# Patient Record
Sex: Male | Born: 1995 | Race: Black or African American | Hispanic: No | Marital: Single | Smoking: Never smoker
Health system: Southern US, Community
[De-identification: ages and names within clinical notes are randomized; demographics above are authoritative.]

## PROBLEM LIST (undated history)

## (undated) DIAGNOSIS — G43909 Migraine, unspecified, not intractable, without status migrainosus: Secondary | ICD-10-CM

---
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
## 2018-12-17 ENCOUNTER — Encounter (HOSPITAL_BASED_OUTPATIENT_CLINIC_OR_DEPARTMENT_OTHER): Payer: Self-pay | Admitting: *Deleted

## 2018-12-17 ENCOUNTER — Emergency Department (HOSPITAL_BASED_OUTPATIENT_CLINIC_OR_DEPARTMENT_OTHER)
Admission: EM | Admit: 2018-12-17 | Discharge: 2018-12-17 | Disposition: A | Discharge status: Home / Self Care | Payer: BC Managed Care – PPO | Attending: Emergency Medicine | Admitting: Emergency Medicine

## 2018-12-17 ENCOUNTER — Other Ambulatory Visit: Payer: Self-pay

## 2018-12-17 DIAGNOSIS — G43409 Hemiplegic migraine, not intractable, without status migrainosus: Secondary | ICD-10-CM | POA: Insufficient documentation

## 2018-12-17 HISTORY — DX: Migraine, unspecified, not intractable, without status migrainosus: G43.909

## 2018-12-17 MED ORDER — KETOROLAC TROMETHAMINE 15 MG/ML IJ SOLN
15.0000 mg | Freq: Once | INTRAMUSCULAR | Status: AC
  Administered 2018-12-17: 15 mg via INTRAVENOUS
  Filled 2018-12-17: qty 1

## 2018-12-17 MED ORDER — SODIUM CHLORIDE 0.9 % IV BOLUS
1000.0000 mL | Freq: Once | INTRAVENOUS | Status: AC
  Administered 2018-12-17: 1000 mL via INTRAVENOUS

## 2018-12-17 MED ORDER — DEXAMETHASONE SODIUM PHOSPHATE 10 MG/ML IJ SOLN
10.0000 mg | Freq: Once | INTRAMUSCULAR | Status: AC
  Administered 2018-12-17: 10 mg via INTRAVENOUS
  Filled 2018-12-17: qty 1

## 2018-12-17 MED ORDER — METOCLOPRAMIDE HCL 5 MG/ML IJ SOLN
10.0000 mg | Freq: Once | INTRAMUSCULAR | Status: AC
  Administered 2018-12-17: 10 mg via INTRAVENOUS
  Filled 2018-12-17: qty 2

## 2018-12-17 MED ORDER — DIPHENHYDRAMINE HCL 50 MG/ML IJ SOLN
25.0000 mg | Freq: Once | INTRAMUSCULAR | Status: AC
  Administered 2018-12-17: 25 mg via INTRAVENOUS
  Filled 2018-12-17: qty 1

## 2018-12-17 NOTE — ED Provider Notes (Signed)
   [place] [facility] Provider Note: Georgena Spurling, MD, FACEP  CSN: [id number] MRN: [id number] ARRIVAL: 12/17/18 at 0419 ROOM: [place]  Migraine   HISTORY OF PRESENT ILLNESS  12/17/18 4:29 AM Phillip Fox is a 23 y.o. male with a personal and strong family history of migraines.  He is here with an acute headache which is been present for 1 to 2 hours.  It is located over the right eye and in the right temple.  It has both dull and throbbing components.  He rates the pain as a 9 out of 10.  He denies blurred vision but has associated photophobia.  He has had nausea but no vomiting.  He has been getting similar headaches almost daily for the last week.  He has taken acetaminophen without relief.   Past Medical History:  Diagnosis Date  . Migraine     History reviewed. No pertinent surgical history.  Family History  Problem Relation Age of Onset  . Migraines Mother   . Migraines Father   . Migraines Maternal Grandmother     Social History   Tobacco Use  . Smoking status: Never Smoker  Substance Use Topics  . Alcohol use: No  . Drug use: No    Prior to Admission medications   Not on File    Allergies Patient has no known allergies.   REVIEW OF SYSTEMS  Negative except as noted here or in the History of Present Illness.   PHYSICAL EXAMINATION  Initial Vital Signs Blood pressure 126/69, pulse 65, temperature 97.7 F (36.5 C), temperature source Oral, resp. rate 18, height 5\' 9"  (1.753 m), weight 66.2 kg, SpO2 99 %.  Examination General: Well-developed, well-nourished male in no acute distress; appearance consistent with age of record HENT: normocephalic; atraumatic Eyes: pupils equal, round and reactive to light; extraocular muscles intact; photophobia Neck: supple Heart: regular rate and rhythm Lungs: clear to auscultation bilaterally Abdomen: soft; nondistended; nontender; bowel sounds present Extremities: No deformity; full range  of motion; pulses normal Neurologic: Awake, alert and oriented; motor function intact in all extremities and symmetric; no facial droop Skin: Warm and dry Psychiatric: Flat affect   RESULTS  Summary of this visit's results, reviewed by myself:   EKG Interpretation  Date/Time:    Ventricular Rate:    PR Interval:    QRS Duration:   QT Interval:    QTC Calculation:   R Axis:     Text Interpretation:        Laboratory Studies: No results found for this or any previous visit (from the past 24 hour(s)). Imaging Studies: No results found.  ED COURSE and MDM  Nursing notes and initial vitals signs, including pulse oximetry, reviewed.  Vitals:   12/17/18 0426  BP: 126/69  Pulse: 65  Resp: 18  Temp: 97.7 F (36.5 C)  TempSrc: Oral  SpO2: 99%  Weight: 66.2 kg  Height: 5\' 9"  (1.753 m)   5:14 AM Patient's headache resolved after IV fluids and medications.  He states he is ready to go home.  PROCEDURES    ED DIAGNOSES     ICD-10-CM   1. Sporadic migraine  G43.[place], MD 12/17/18 [phone]

## 2018-12-17 NOTE — ED Triage Notes (Signed)
Migraine x 1 week.  Over right eye.  Hx of same.  Took tylenol PTA

## 2020-08-30 ENCOUNTER — Emergency Department (HOSPITAL_BASED_OUTPATIENT_CLINIC_OR_DEPARTMENT_OTHER): Payer: BC Managed Care – PPO

## 2020-08-30 ENCOUNTER — Other Ambulatory Visit: Payer: Self-pay

## 2020-08-30 ENCOUNTER — Emergency Department (HOSPITAL_BASED_OUTPATIENT_CLINIC_OR_DEPARTMENT_OTHER)
Admission: EM | Admit: 2020-08-30 | Discharge: 2020-08-30 | Disposition: A | Discharge status: Home / Self Care | Payer: BC Managed Care – PPO | Attending: Emergency Medicine | Admitting: Emergency Medicine

## 2020-08-30 ENCOUNTER — Encounter (HOSPITAL_BASED_OUTPATIENT_CLINIC_OR_DEPARTMENT_OTHER): Payer: Self-pay

## 2020-08-30 DIAGNOSIS — R110 Nausea: Secondary | ICD-10-CM | POA: Diagnosis not present

## 2020-08-30 DIAGNOSIS — R519 Headache, unspecified: Secondary | ICD-10-CM | POA: Insufficient documentation

## 2020-08-30 DIAGNOSIS — F17290 Nicotine dependence, other tobacco product, uncomplicated: Secondary | ICD-10-CM | POA: Insufficient documentation

## 2020-08-30 MED ORDER — KETOROLAC TROMETHAMINE 15 MG/ML IJ SOLN
15.0000 mg | Freq: Once | INTRAMUSCULAR | Status: AC
  Administered 2020-08-30: 15 mg via INTRAVENOUS
  Filled 2020-08-30: qty 1

## 2020-08-30 MED ORDER — PROCHLORPERAZINE EDISYLATE 10 MG/2ML IJ SOLN
10.0000 mg | Freq: Once | INTRAMUSCULAR | Status: AC
  Administered 2020-08-30: 10 mg via INTRAVENOUS
  Filled 2020-08-30: qty 2

## 2020-08-30 MED ORDER — DIPHENHYDRAMINE HCL 50 MG/ML IJ SOLN
25.0000 mg | Freq: Once | INTRAMUSCULAR | Status: AC
  Administered 2020-08-30: 25 mg via INTRAVENOUS
  Filled 2020-08-30: qty 1

## 2020-08-30 NOTE — ED Provider Notes (Signed)
[facility] EMERGENCY DEPARTMENT Provider Note   CSN: [id number] Arrival date & time: 08/30/20  1157     History Chief Complaint  Patient presents with  . Headache    Phillip Fox is a 25 y.o. male.  HPI 25 year old male with a history of migraines presents to the ER with complaints of headache.  Patient states he has been having intermittent headaches over the last couple weeks, however today he woke up with a much more severe headache than normal.  He endorses pulsating pain, light sensitivity.  No vision changes.  Mild nausea but no vomiting.  He reports prior history of migraines and headaches, but has not been seen by neurology.  He took Tylenol this morning with little relief.  Denies any unilateral weakness, word slurring, difficulty speaking.    Past Medical History:  Diagnosis Date  . Migraine     There are no problems to display for this patient.   No past surgical history on file.     Family History  Problem Relation Age of Onset  . Migraines Mother   . Migraines Father   . Migraines Maternal Grandmother     Social History   Tobacco Use  . Smoking status: Never Smoker  . Smokeless tobacco: Current User  Vaping Use  . Vaping Use: Every day  Substance Use Topics  . Alcohol use: No  . Drug use: No    Home Medications Prior to Admission medications   Not on File    Allergies    Patient has no known allergies.  Review of Systems   Review of Systems  Eyes: Negative for visual disturbance.  Neurological: Positive for headaches. Negative for dizziness, syncope and facial asymmetry.    Physical Exam Updated Vital Signs BP 124/77 (BP Location: Right Arm)   Pulse 64   Temp 98.2 F (36.8 C) (Oral)   Resp 18   Ht 5\' 9"  (1.753 m)   Wt 67.1 kg   SpO2 98%   BMI 21.86 kg/m   Physical Exam Vitals and nursing note reviewed.  Constitutional:      General: He is not in acute distress.    Appearance: He is well-developed. He is not  ill-appearing, toxic-appearing or diaphoretic.  HENT:     Head: Normocephalic and atraumatic.  Eyes:     General: No visual field deficit.    Conjunctiva/sclera: Conjunctivae normal.  Cardiovascular:     Rate and Rhythm: Normal rate and regular rhythm.     Heart sounds: No murmur heard.   Pulmonary:     Effort: Pulmonary effort is normal. No respiratory distress.     Breath sounds: Normal breath sounds.  Abdominal:     Palpations: Abdomen is soft.     Tenderness: There is no abdominal tenderness.  Musculoskeletal:     Cervical back: Neck supple.  Skin:    General: Skin is warm and dry.  Neurological:     Mental Status: He is alert and oriented to person, place, and time.     GCS: GCS eye subscore is 4. GCS verbal subscore is 5. GCS motor subscore is 6.     Cranial Nerves: No cranial nerve deficit, dysarthria or facial asymmetry.     Sensory: No sensory deficit.     Motor: No weakness.     Deep Tendon Reflexes: Reflexes normal.  Psychiatric:        Mood and Affect: Mood normal.        Behavior: Behavior normal.  ED Results / Procedures / Treatments   Labs (all labs ordered are listed, but only abnormal results are displayed) Labs Reviewed - No data to display  EKG None  Radiology CT Head Wo Contrast  Result Date: 08/30/2020 CLINICAL DATA:  Progressive headache and nausea.  Photophobia EXAM: CT HEAD WITHOUT CONTRAST TECHNIQUE: Contiguous axial images were obtained from the base of the skull through the vertex without intravenous contrast. COMPARISON:  None. FINDINGS: Brain: Ventricles and sulci are normal in size and configuration. There is no intracranial mass, hemorrhage, extra-axial fluid collection, or midline shift. Brain parenchyma appears unremarkable. No evident acute infarct. Vascular: No hyperdense vessel.  No evident vascular calcification. Skull: The bony calvarium appears intact. Sinuses/Orbits: There is a small retention cyst in the lateral left maxillary  antrum. Slight mucosal thickening noted in several ethmoid air cells. Other visualized paranasal sinuses are clear. Orbits appear symmetric bilaterally. Other: Mastoid air cells are clear. IMPRESSION: Mild paranasal sinus disease.  Study otherwise unremarkable. Electronically Signed   By: Bretta Bang III M.D.   On: 08/30/2020 12:33    Procedures Procedures   Medications Ordered in ED Medications  prochlorperazine (COMPAZINE) injection 10 mg (10 mg Intravenous Given 08/30/20 1254)  diphenhydrAMINE (BENADRYL) injection 25 mg (25 mg Intravenous Given 08/30/20 1250)  ketorolac (TORADOL) 15 MG/ML injection 15 mg (15 mg Intravenous Given 08/30/20 1252)    ED Course  I have reviewed the triage vital signs and the nursing notes.  Pertinent labs & imaging results that were available during my care of the patient were reviewed by me and considered in my medical decision making (see chart for details).    MDM Rules/Calculators/A&P                          25 year old male presents to the ER with complaints of headache.  Neuro exam overall reassuring, no meningeal signs, vitals reassuring.  Patient does report increased severity from prior headaches.  Per chart review, he has had no head imaging.  CT of the head here without any evidence of space-occupying lesions, bleeds.  CT does comment on paraspinal sinus disease but patient does not complain of any sinus congestion.  Patient received migraine cocktail, Toradol with significant improvement in his symptoms.  He is feeling well enough to go home.  Will refer to neurology.  We discussed return precautions.  He was understanding and is agreeable Final Clinical Impression(s) / ED Diagnoses Final diagnoses:  Bad headache    Rx / DC Orders ED Discharge Orders    None       Leone Brand 08/30/20 1347    Cheryll Cockayne, MD 09/05/20 2316

## 2020-08-30 NOTE — Discharge Instructions (Addendum)
You were evaluated in the Emergency Department and after careful evaluation, we did not find any emergent condition requiring admission or further testing in the hospital.  Please make an appointment with neurology for follow-up.  Please return to the Emergency Department if you experience any worsening of your condition.  We encourage you to follow up with a primary care provider.  Thank you for allowing [place] to be a part of your care.

## 2020-08-30 NOTE — ED Triage Notes (Signed)
HA x 3 hours today.  Pt reports intermittent HA x 2 weeks.  PT does endorse nausea with light sensitivity but no vomiting.

## 2020-10-02 ENCOUNTER — Emergency Department (HOSPITAL_BASED_OUTPATIENT_CLINIC_OR_DEPARTMENT_OTHER)
Admission: EM | Admit: 2020-10-02 | Discharge: 2020-10-03 | Disposition: A | Discharge status: Home / Self Care | Payer: BC Managed Care – PPO | Attending: Emergency Medicine | Admitting: Emergency Medicine

## 2020-10-02 ENCOUNTER — Other Ambulatory Visit: Payer: Self-pay

## 2020-10-02 ENCOUNTER — Encounter (HOSPITAL_BASED_OUTPATIENT_CLINIC_OR_DEPARTMENT_OTHER): Payer: Self-pay | Admitting: *Deleted

## 2020-10-02 DIAGNOSIS — S01551A Open bite of lip, initial encounter: Secondary | ICD-10-CM | POA: Insufficient documentation

## 2020-10-02 DIAGNOSIS — W540XXA Bitten by dog, initial encounter: Secondary | ICD-10-CM | POA: Insufficient documentation

## 2020-10-02 DIAGNOSIS — Z23 Encounter for immunization: Secondary | ICD-10-CM | POA: Insufficient documentation

## 2020-10-02 DIAGNOSIS — S0993XA Unspecified injury of face, initial encounter: Secondary | ICD-10-CM | POA: Diagnosis present

## 2020-10-02 MED ORDER — TETANUS-DIPHTH-ACELL PERTUSSIS 5-2.5-18.5 LF-MCG/0.5 IM SUSY
0.5000 mL | PREFILLED_SYRINGE | Freq: Once | INTRAMUSCULAR | Status: AC
  Administered 2020-10-02: 0.5 mL via INTRAMUSCULAR
  Filled 2020-10-02: qty 0.5

## 2020-10-02 MED ORDER — AMOXICILLIN-POT CLAVULANATE 875-125 MG PO TABS
1.0000 | ORAL_TABLET | Freq: Once | ORAL | Status: AC
  Administered 2020-10-02: 1 via ORAL
  Filled 2020-10-02: qty 1

## 2020-10-02 MED ORDER — LIDOCAINE-EPINEPHRINE-TETRACAINE (LET) TOPICAL GEL
3.0000 mL | Freq: Once | TOPICAL | Status: AC
  Administered 2020-10-02: 3 mL via TOPICAL
  Filled 2020-10-02: qty 3

## 2020-10-02 NOTE — ED Provider Notes (Signed)
LACERATION REPAIR Performed by: Linwood Dibbles Authorized by: Linwood Dibbles Consent: Verbal consent obtained. Risks and benefits: risks, benefits and alternatives were discussed Consent given by: patient Patient identity confirmed: provided demographic data Prepped and Draped in normal sterile fashion Wound explored  Laceration Location: upper lip  Laceration Length: 5cm, jagged, stellate   No Foreign Bodies seen or palpated  Anesthesia: local infiltration  Local anesthetic: LET  Anesthetic total: Topical LET  Irrigation method: syringe Amount of cleaning: standard  Skin closure: 5.0 Prolene  Number of sutures: 8  Technique: simple interrupted   Patient tolerance: Patient tolerated the procedure well with no immediate complications.    Cleatus Goodin A, PA-C 10/03/20 0022    Molpus, Jonny Ruiz, MD 10/03/20 [phone]

## 2020-10-02 NOTE — ED Provider Notes (Signed)
[facility] Provider Note: Lowella Dell, MD, FACEP  CSN: [id number] MRN: [id number] ARRIVAL: 10/02/20 at 1906 ROOM: [facility]   CHIEF COMPLAINT  Animal Bite   HISTORY OF PRESENT ILLNESS  10/02/20 11:33 PM Phillip Fox is a 25 y.o. male who was bitten by his dog about 6:30 PM.  The dog bit his upper lip and he has several lacerations to his upper lip.  Bleeding is controlled.  Associated pain is about a 5 out of 10, worse with movement or palpation of his lip.  He is not sure about his tetanus status.  His dog's immunizations are up-to-date.   Past Medical History:  Diagnosis Date   Migraine     History reviewed. No pertinent surgical history.  Family History  Problem Relation Age of Onset   Migraines Mother    Migraines Father    Migraines Maternal Grandmother     Social History   Tobacco Use   Smoking status: Never   Smokeless tobacco: Current  Vaping Use   Vaping Use: Every day  Substance Use Topics   Alcohol use: No   Drug use: No    Prior to Admission medications   Not on File    Allergies Patient has no known allergies.   REVIEW OF SYSTEMS  Negative except as noted here or in the History of Present Illness.   PHYSICAL EXAMINATION  Initial Vital Signs Blood pressure 119/75, pulse (!) 46, temperature 97.8 F (36.6 C), temperature source Axillary, resp. rate 18, height 5\' 9"  (1.753 m), weight 64 kg, SpO2 100 %.  Examination General: Well-developed, well-nourished male in no acute distress; appearance consistent with age of record HENT: normocephalic; lacerations to skin of upper lip with swelling, no lacerations on buccal surface of lip:    Eyes: pupils equal, round and reactive to light; extraocular muscles intact Neck: supple Heart: regular rate and rhythm Lungs: clear to auscultation bilaterally Abdomen: soft; nondistended; nontender; bowel sounds present Extremities: No deformity; full range of motion Neurologic: Awake,  alert and oriented; motor function intact in all extremities and symmetric; no facial droop Skin: Warm and dry Psychiatric: Normal mood and affect   RESULTS  Summary of this visit's results, reviewed and interpreted by myself:   EKG Interpretation  Date/Time:    Ventricular Rate:    PR Interval:    QRS Duration:   QT Interval:    QTC Calculation:   R Axis:     Text Interpretation:          Laboratory Studies: No results found for this or any previous visit (from the past 24 hour(s)). Imaging Studies: No results found.  ED COURSE and MDM  Nursing notes, initial and subsequent vitals signs, including pulse oximetry, reviewed and interpreted by myself.  Vitals:   10/02/20 1926 10/02/20 1930 10/02/20 2208 10/03/20 0000  BP:  113/69 119/75 120/65  Pulse:  66 (!) 46 (!) 52  Resp:  14 18 18   Temp:  97.8 F (36.6 C)    TempSrc:  Axillary    SpO2:  100% 100% 100%  Weight: 64 kg     Height: 5\' 9"  (1.753 m)      Medications  Tdap (BOOSTRIX) injection 0.5 mL (0.5 mLs Intramuscular Given 10/02/20 2354)  amoxicillin-clavulanate (AUGMENTIN) 875-125 MG per tablet 1 tablet (1 tablet Oral Given 10/02/20 2354)  lidocaine-EPINEPHrine-tetracaine (LET) topical gel (3 mLs Topical Given 10/02/20 2349)   Wounds closed by Britni Hendley, PA-C.  Will place on Augmentin for dog  bite prophylaxis.    PROCEDURES  Procedures   ED DIAGNOSES     ICD-10-CM   1. Dog bite of vermilion of upper lip, initial encounter  S01.551A    W54.0XXA     2. Dog bite of skin of lip, initial encounter  S01.551A    W54.Olin Hauser, MD 10/03/20 [phone]

## 2020-10-02 NOTE — ED Notes (Signed)
ED Provider at bedside. 

## 2020-10-02 NOTE — ED Triage Notes (Signed)
His dog bit him his upper lip. Through and through lacerations noted.

## 2020-10-03 MED ORDER — AMOXICILLIN-POT CLAVULANATE 875-125 MG PO TABS: 1.0000 | ORAL_TABLET | Freq: Two times a day (BID) | ORAL | 0 refills | Status: AC

## 2020-11-26 ENCOUNTER — Telehealth: Payer: Self-pay | Admitting: Neurology

## 2020-11-26 ENCOUNTER — Ambulatory Visit: Payer: BC Managed Care – PPO | Admitting: Neurology

## 2020-11-26 ENCOUNTER — Telehealth: Payer: Self-pay | Admitting: *Deleted

## 2020-11-26 ENCOUNTER — Encounter: Payer: Self-pay | Admitting: Neurology

## 2020-11-26 NOTE — Telephone Encounter (Signed)
Patient no showed his new patient appt today. This is his first no show at [facility].

## 2020-11-26 NOTE — Telephone Encounter (Signed)
No-show for neurology.  This was new patient appointment.  If patient calls back they can be scheduled with any provider however please remind him that we have many patients waiting for appointments in neurology, and another no-show or cancellation within 24 hours may result in dismissal from the practice per office policy.

## 2020-11-26 NOTE — Progress Notes (Deleted)
[facility]    Provider:  Dr Lucia Gaskins Requesting Provider: Roe Coombs* Primary Care Provider:  [facility]  CC:  migraine  HPI:  Phillip Fox is a 25 y.o. male here as requested by Roe Coombs* for migraines. PMHx migraine.  Patient was referred from the emergency room, history of migraines, she was seen in the emergency room in early June of this year, intermittent headaches, that day woke up with a much more severe headache than normal, pulsating pain, light sensitivity, no vision changes, mild nausea but no vomiting, he reports prior history of migraines and headaches has not been seen by neurology, Tylenol did not help no other focal neurologic symptoms were present.  Examination including physical and neurologic was normal.  CT of the head was completed unremarkable.  He received a migraine cocktail.  Reviewed notes, labs and imaging from outside physicians, which showed:    CT head 08/30/2020: showed No acute intracranial abnormalities including mass lesion or mass effect, hydrocephalus, extra-axial fluid collection, midline shift, hemorrhage, or acute infarction, large ischemic events (personally reviewed images)    Review of Systems: Patient complains of symptoms per HPI as well as the following symptoms ***. Pertinent negatives and positives per HPI. All others negative.   Social History   Socioeconomic History   Marital status: Single    Spouse name: Not on file   Number of children: Not on file   Years of education: Not on file   Highest education level: Not on file  Occupational History   Not on file  Tobacco Use   Smoking status: Never   Smokeless tobacco: Current  Vaping Use   Vaping Use: Every day  Substance and Sexual Activity   Alcohol use: No   Drug use: No   Sexual activity: Not on file  Other Topics Concern   Not on file  Social History Narrative   Not on file   Social  Determinants of Health   Financial Resource Strain: Not on file  Food Insecurity: Not on file  Transportation Needs: Not on file  Physical Activity: Not on file  Stress: Not on file  Social Connections: Not on file  Intimate Partner Violence: Not on file    Family History  Problem Relation Age of Onset   Migraines Mother    Migraines Father    Migraines Maternal Grandmother     Past Medical History:  Diagnosis Date   Migraine     There are no problems to display for this patient.   No past surgical history on file.  Current Outpatient Medications  Medication Sig Dispense Refill   amoxicillin-clavulanate (AUGMENTIN) 875-125 MG tablet Take 1 tablet by mouth every 12 (twelve) hours. 14 tablet 0   No current facility-administered medications for this visit.    Allergies as of 11/26/2020   (No Known Allergies)    Vitals: There were no vitals taken for this visit. Last Weight:  Wt Readings from Last 1 Encounters:  10/02/20 141 lb 1.6 oz (64 kg)   Last Height:   Ht Readings from Last 1 Encounters:  10/02/20 5\' 9"  (1.753 m)     Physical exam: Exam: Gen: NAD, conversant, well nourised, obese, well groomed                     CV: RRR, no MRG. No Carotid Bruits. No peripheral edema, warm, nontender Eyes: Conjunctivae clear without exudates or hemorrhage  Neuro: Detailed Neurologic Exam  Speech:  Speech is normal; fluent and spontaneous with normal comprehension.  Cognition:    The patient is oriented to person, place, and time;     recent and remote memory intact;     language fluent;     normal attention, concentration,     fund of knowledge Cranial Nerves:    The pupils are equal, round, and reactive to light. The fundi are normal and spontaneous venous pulsations are present. Visual fields are full to finger confrontation. Extraocular movements are intact. Trigeminal sensation is intact and the muscles of mastication are normal. The face is symmetric.  The palate elevates in the midline. Hearing intact. Voice is normal. Shoulder shrug is normal. The tongue has normal motion without fasciculations.   Coordination:    Normal finger to nose and heel to shin. Normal rapid alternating movements.   Gait:    Heel-toe and tandem gait are normal.   Motor Observation:    No asymmetry, no atrophy, and no involuntary movements noted. Tone:    Normal muscle tone.    Posture:    Posture is normal. normal erect    Strength:    Strength is V/V in the upper and lower limbs.      Sensation: intact to LT     Reflex Exam:  DTR's:    Deep tendon reflexes in the upper and lower extremities are normal bilaterally.   Toes:    The toes are downgoing bilaterally.   Clonus:    Clonus is absent.    Assessment/Plan:    No orders of the defined types were placed in this encounter.  No orders of the defined types were placed in this encounter.   Cc: [place], Roe Coombs, [facility]  Naomie Dean, MD  [facility] [address] [place], [place] [address]  Phone [phone] Fax [phone]

## 2022-05-10 IMAGING — CT CT HEAD W/O CM
3 series · 16 of 47 positions shown, 19 images · non-contrast
Comparison: None.

CLINICAL DATA: Progressive headache and nausea.  Photophobia

EXAM:
CT HEAD WITHOUT CONTRAST
TECHNIQUE: Contiguous axial images were obtained from the base of the skull
through the vertex without intravenous contrast.

[Series 2: head wo · axial · 0.43mm/px · z∈[-114,+22]mm · 10 of 33 slices shown, 13 images]
[im 3/33  brain]
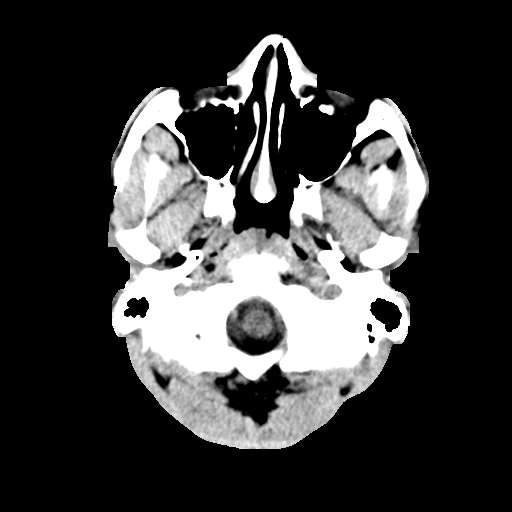
[im 3/33  bone]
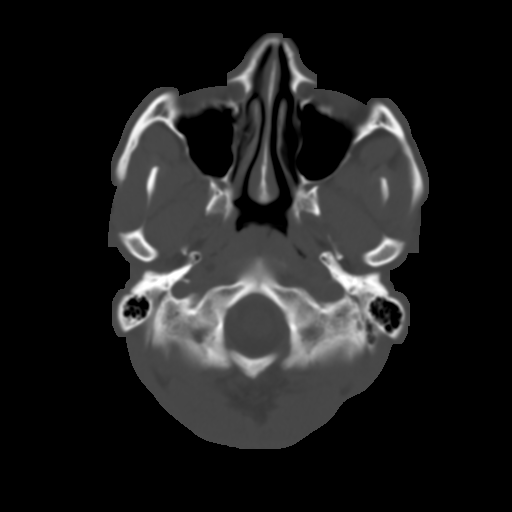
[im 6/33  brain]
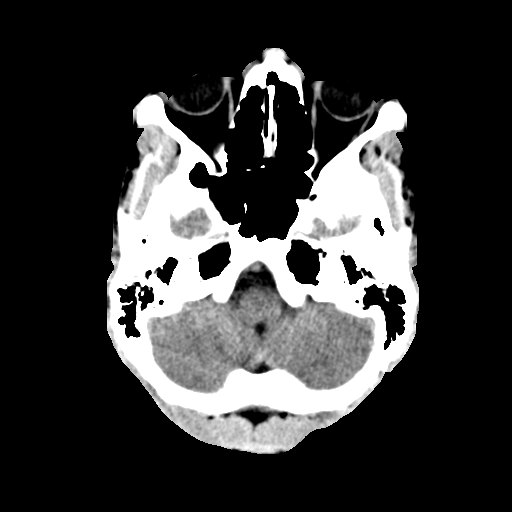
[im 9/33  brain]
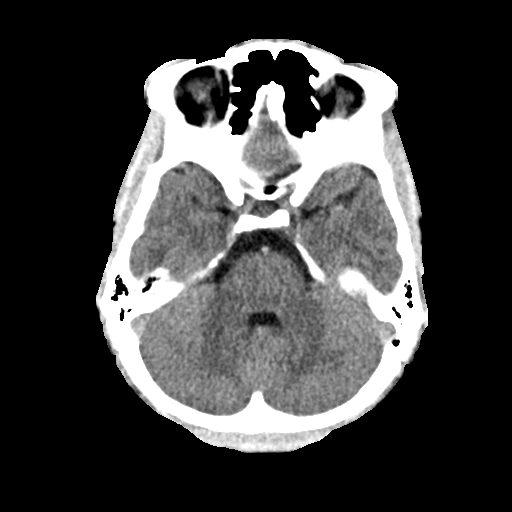
[im 12/33  brain]
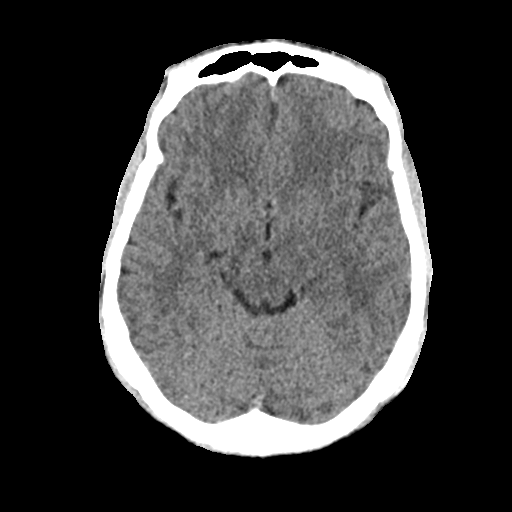
[im 15/33  brain]
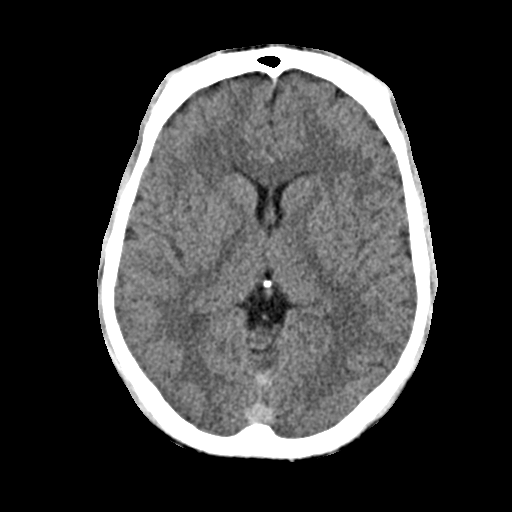
[im 15/33  bone]
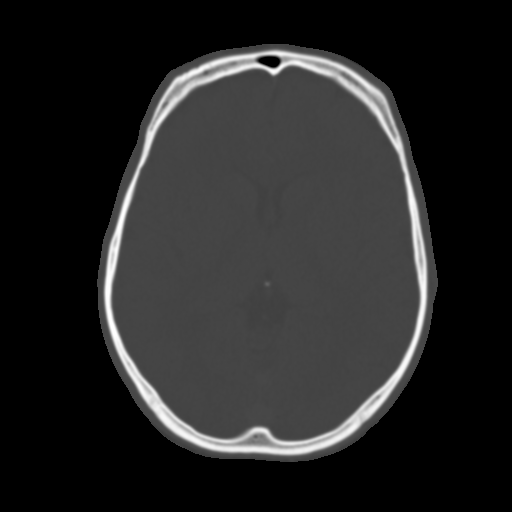
[im 18/33  brain]
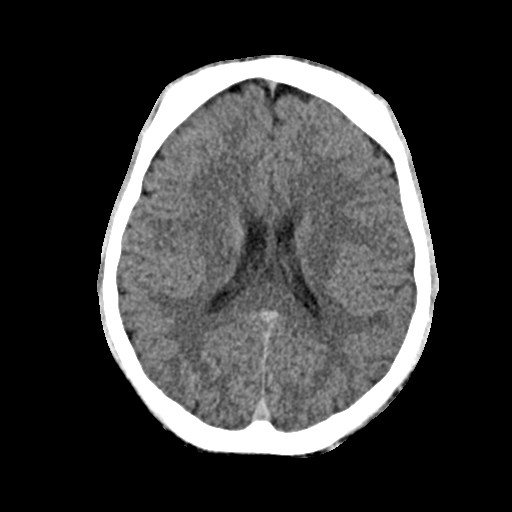
[im 21/33  brain]
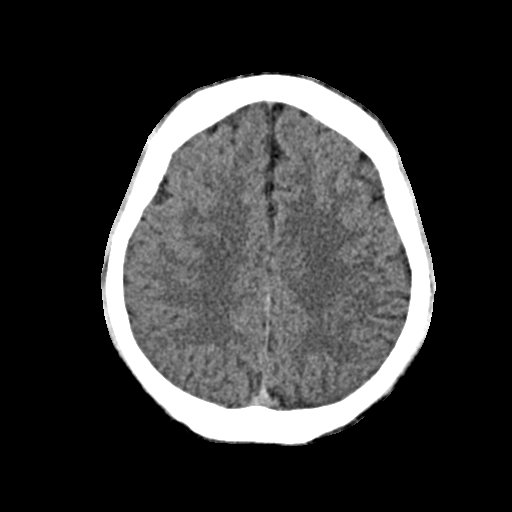
[im 25/33  brain]
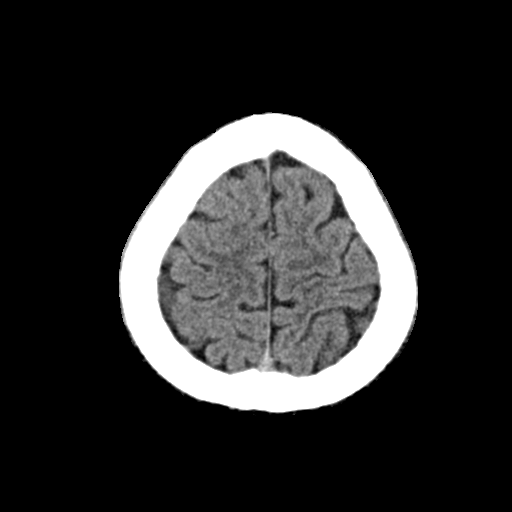
[im 27/33  brain]
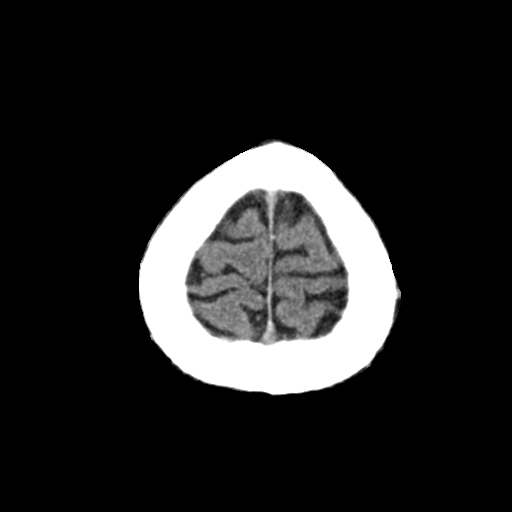
[im 27/33  bone]
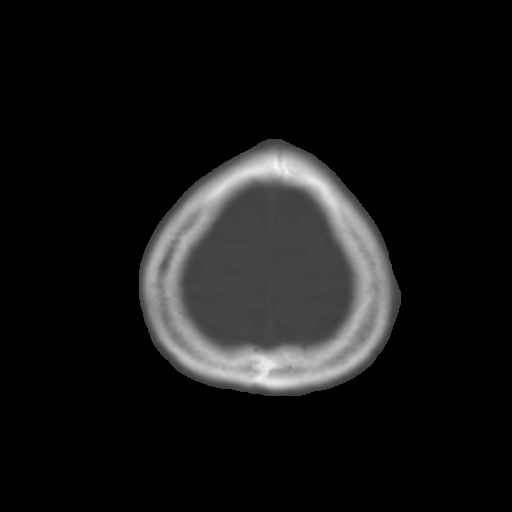
[im 30/33  brain]
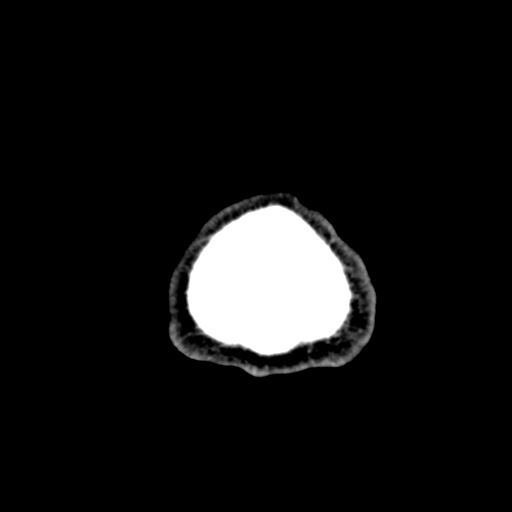

[Series 4: coronal soft · coronal · 0.40mm/px · 3 of 68 slices shown]
[im 23/68  brain]
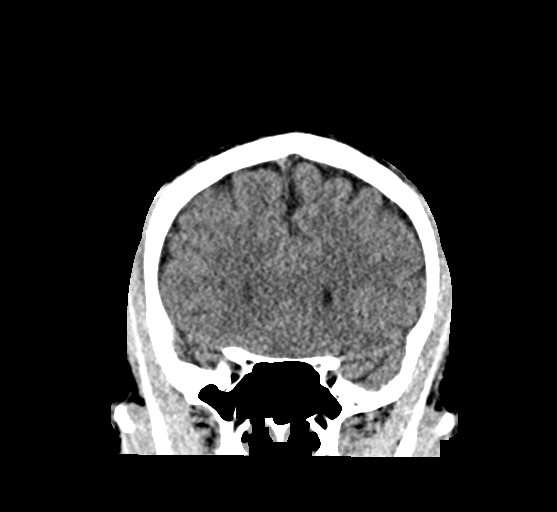
[im 30/68  brain]
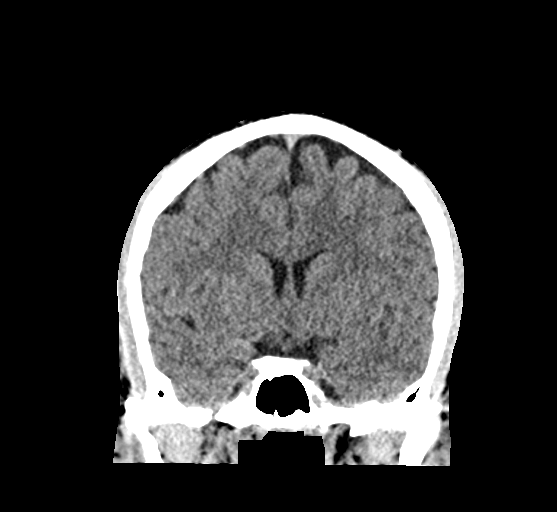
[im 38/68  brain]
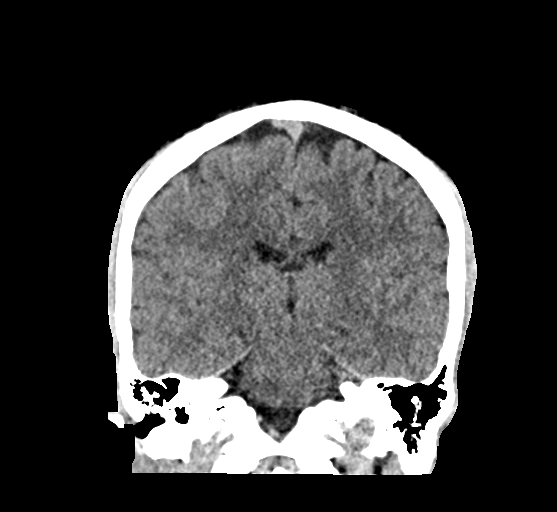

[Series 5: sag soft · sagittal · 0.40mm/px · 3 of 59 slices shown]
[im 20/59  brain]
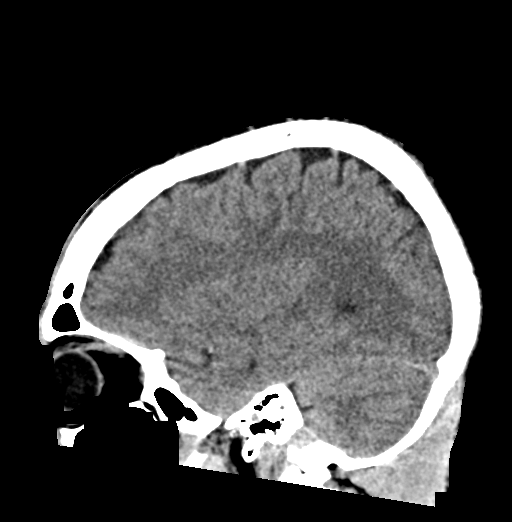
[im 30/59  brain]
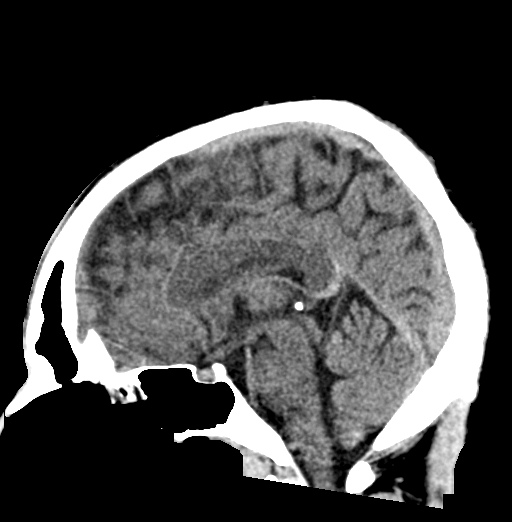
[im 39/59  brain]
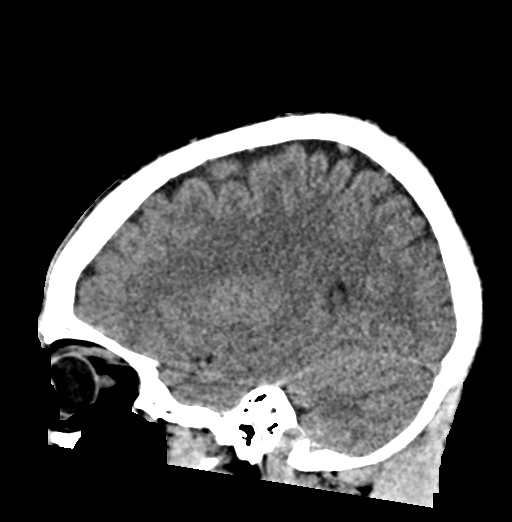

[16 of 47 positions shown; findings below may reference images not displayed]

FINDINGS: Brain: Ventricles and sulci are normal in size and configuration.
There is no intracranial mass, hemorrhage, extra-axial fluid
collection, or midline shift. Brain parenchyma appears unremarkable.
No evident acute infarct.

Vascular: No hyperdense vessel.  No evident vascular calcification.

Skull: The bony calvarium appears intact.

Sinuses/Orbits: There is a small retention cyst in the lateral left
maxillary antrum. Slight mucosal thickening noted in several ethmoid
air cells. Other visualized paranasal sinuses are clear. Orbits
appear symmetric bilaterally.

Other: Mastoid air cells are clear.
IMPRESSION: Mild paranasal sinus disease.  Study otherwise unremarkable.

## 2023-03-25 ENCOUNTER — Ambulatory Visit: Payer: Self-pay

## 2023-11-20 ENCOUNTER — Emergency Department
Admission: EM | Admit: 2023-11-20 | Discharge: 2023-11-20 | Disposition: A | Discharge status: Home / Self Care | Attending: Emergency Medicine | Admitting: Emergency Medicine

## 2023-11-20 ENCOUNTER — Other Ambulatory Visit: Payer: Self-pay

## 2023-11-20 DIAGNOSIS — R519 Headache, unspecified: Secondary | ICD-10-CM | POA: Insufficient documentation

## 2023-11-20 LAB — RESP PANEL BY RT-PCR (RSV, FLU A&B, COVID)  RVPGX2
Influenza A by PCR: NEGATIVE
Influenza B by PCR: NEGATIVE
Resp Syncytial Virus by PCR: NEGATIVE
SARS Coronavirus 2 by RT PCR: NEGATIVE

## 2023-11-20 MED ORDER — BUTALBITAL-APAP-CAFFEINE 50-325-40 MG PO TABS: 1.0000 | ORAL_TABLET | Freq: Four times a day (QID) | ORAL | 0 refills | Status: AC | PRN

## 2023-11-20 MED ORDER — ONDANSETRON 4 MG PO TBDP
4.0000 mg | ORAL_TABLET | Freq: Once | ORAL | Status: AC
  Administered 2023-11-20: 4 mg via ORAL
  Filled 2023-11-20: qty 1

## 2023-11-20 MED ORDER — DEXAMETHASONE SODIUM PHOSPHATE 10 MG/ML IJ SOLN
10.0000 mg | Freq: Once | INTRAMUSCULAR | Status: AC
  Administered 2023-11-20: 10 mg via INTRAMUSCULAR
  Filled 2023-11-20: qty 1

## 2023-11-20 MED ORDER — KETOROLAC TROMETHAMINE 30 MG/ML IJ SOLN
30.0000 mg | Freq: Once | INTRAMUSCULAR | Status: AC
  Administered 2023-11-20: 30 mg via INTRAMUSCULAR
  Filled 2023-11-20: qty 1

## 2023-11-20 NOTE — ED Provider Notes (Signed)
 [facility] Emergency Department Provider Note     Event Date/Time   First MD Initiated Contact with Patient 11/20/23 1726     (approximate)   History   Migraine   HPI  Phillip Fox is a 28 y.o. male with a past medical history of migraines presents to the ED for evaluation of a unilateral headache that started last night resolved and then returned with increase severity today. Endorses light sensitivity. Denies vision changes, fever, neck stiffness, and nausea or vomiting. No sick contacts, but states has had a recent cold.  Has tried Excedrin with no relief no other complaint.      Physical Exam   Triage Vital Signs: ED Triage Vitals  Encounter Vitals Group     BP 11/20/23 1600 (!) 112/94     Girls Systolic BP Percentile --      Girls Diastolic BP Percentile --      Boys Systolic BP Percentile --      Boys Diastolic BP Percentile --      Pulse Rate 11/20/23 1600 (!) 56     Resp 11/20/23 1600 20     Temp 11/20/23 1600 98 F (36.7 C)     Temp Source 11/20/23 1600 Oral     SpO2 11/20/23 1600 100 %     Weight 11/20/23 1602 157 lb (71.2 kg)     Height 11/20/23 1602 5' 9 (1.753 m)     Head Circumference --      Peak Flow --      Pain Score 11/20/23 1601 8     Pain Loc --      Pain Education --      Exclude from Growth Chart --     Most recent vital signs: Vitals:   11/20/23 1600  BP: (!) 112/94  Pulse: (!) 56  Resp: 20  Temp: 98 F (36.7 C)  SpO2: 100%   General: Well appearing. Covering his eyes from light. Alert and oriented. INAD.   Head:  NCAT.  Eyes:  PERRLA. EOMI.  Nose:   Mucosa is moist. No rhinorrhea. Neck:   No cervical spine tenderness to palpation. Full ROM without difficulty.  CV:  Good peripheral perfusion.   RESP:  Normal effort.   NEURO: Cranial nerves intact. No focal deficits. Speech is clear. Sensation and motor function intact. Gait is steady.   ED Results / Procedures / Treatments   Labs (all labs  ordered are listed, but only abnormal results are displayed) Labs Reviewed  RESP PANEL BY RT-PCR (RSV, FLU A&B, COVID)  RVPGX2   No results found.  PROCEDURES:  Critical Care performed: No  Procedures   MEDICATIONS ORDERED IN ED: Medications  ketorolac  (TORADOL ) 30 MG/ML injection 30 mg (30 mg Intramuscular Given 11/20/23 1758)  dexamethasone  (DECADRON ) injection 10 mg (10 mg Intramuscular Given 11/20/23 1758)  ondansetron  (ZOFRAN -ODT) disintegrating tablet 4 mg (4 mg Oral Given 11/20/23 1759)     IMPRESSION / MDM / ASSESSMENT AND PLAN / ED COURSE  I reviewed the triage vital signs and the nursing notes.                               28 y.o. male presents to the emergency department for evaluation and treatment of migraine. See HPI for further details.   Differential diagnosis includes, but is not limited to, tension headache, temporal arteritis, migraine or migraine equivalent, and non-specific headache.  Patient's presentation is most consistent with acute complicated illness / injury requiring diagnostic workup.  Patient has history of migraines and today's presentation is consistent with his history. No red flag signs to indicate imaging or further work up with labs. Plan to administer migraine cocktail.  Respiratory panel reassuring.  Patient is stable condition for discharge home.  Advised to follow-up with primary care in 1 week.  FINAL CLINICAL IMPRESSION(S) / ED DIAGNOSES   Final diagnoses:  Bad headache   Rx / DC Orders   ED Discharge Orders          Ordered    butalbital -acetaminophen-caffeine  (FIORICET) 50-325-40 MG tablet  Every 6 hours PRN        11/20/23 1826             Note:  This document was prepared using Dragon voice recognition software and may include unintentional dictation errors.    Margrette, Abrina Petz A, PA-C 11/20/23 1827    Arlander Charleston, MD 11/20/23 RONOLD

## 2023-11-20 NOTE — Discharge Instructions (Addendum)
 You were seen in the emergency department today for a cough. Your respiratory panel which includes COVID, flu, and RSV were negative.   A viral cough may last up to 2-3 weeks.   Pain control:  Ibuprofen  (motrin /aleve/advil ) - You can take 3 tablets (600 mg) every 6 hours as needed for pain/fever.  Acetaminophen (tylenol) - You can take 2 extra strength tablets (1000 mg) every 6 hours as needed for pain/fever.  You can alternate these medications or take them together.  Make sure you eat food/drink water when taking these medications.   Stay hydrated by drinking plenty of fluids to thin mucus. Get adequate amount of sleep and avoid overexertion. Consider a humidifier at night. Warm teas and a spoonful of honey may help reduce cough frequency. Follow up with your primary care provider in 1 week.

## 2023-11-20 NOTE — ED Triage Notes (Signed)
 Pt to ED for migraine since 4-5 days. Hx of same. Took Excedrin around 245pm. Also has had a cold, no sore throat. HA is behind L eye and to L temporal area. Pt also nauseous.
